# Patient Record
Sex: Male | Born: 2000 | Race: White | Hispanic: No | Marital: Single | Smoking: Never smoker
Health system: Southern US, Community
[De-identification: ages and names within clinical notes are randomized; demographics above are authoritative.]

## PROBLEM LIST (undated history)

## (undated) DIAGNOSIS — T7840XA Allergy, unspecified, initial encounter: Secondary | ICD-10-CM

## (undated) DIAGNOSIS — S49021A Salter-Harris Type II physeal fracture of upper end of humerus, right arm, initial encounter for closed fracture: Secondary | ICD-10-CM

## (undated) DIAGNOSIS — Z8489 Family history of other specified conditions: Secondary | ICD-10-CM

---
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
## 2021-07-04 ENCOUNTER — Other Ambulatory Visit: Payer: Self-pay | Admitting: Nurse Practitioner

## 2021-07-04 ENCOUNTER — Ambulatory Visit
Admission: RE | Admit: 2021-07-04 | Discharge: 2021-07-04 | Disposition: A | Discharge status: Home / Self Care | Payer: No Typology Code available for payment source | Source: Ambulatory Visit | Attending: Nurse Practitioner | Admitting: Nurse Practitioner

## 2021-07-04 DIAGNOSIS — Z021 Encounter for pre-employment examination: Secondary | ICD-10-CM

## 2023-07-16 IMAGING — CR DG CHEST 1V
1 series · 1 of 1 positions shown · non-contrast
Comparison: None.

CLINICAL DATA: 20-year-old male with a history of pre-employment
chest x-ray

EXAM:
CHEST  1 VIEW

[w chest pa]
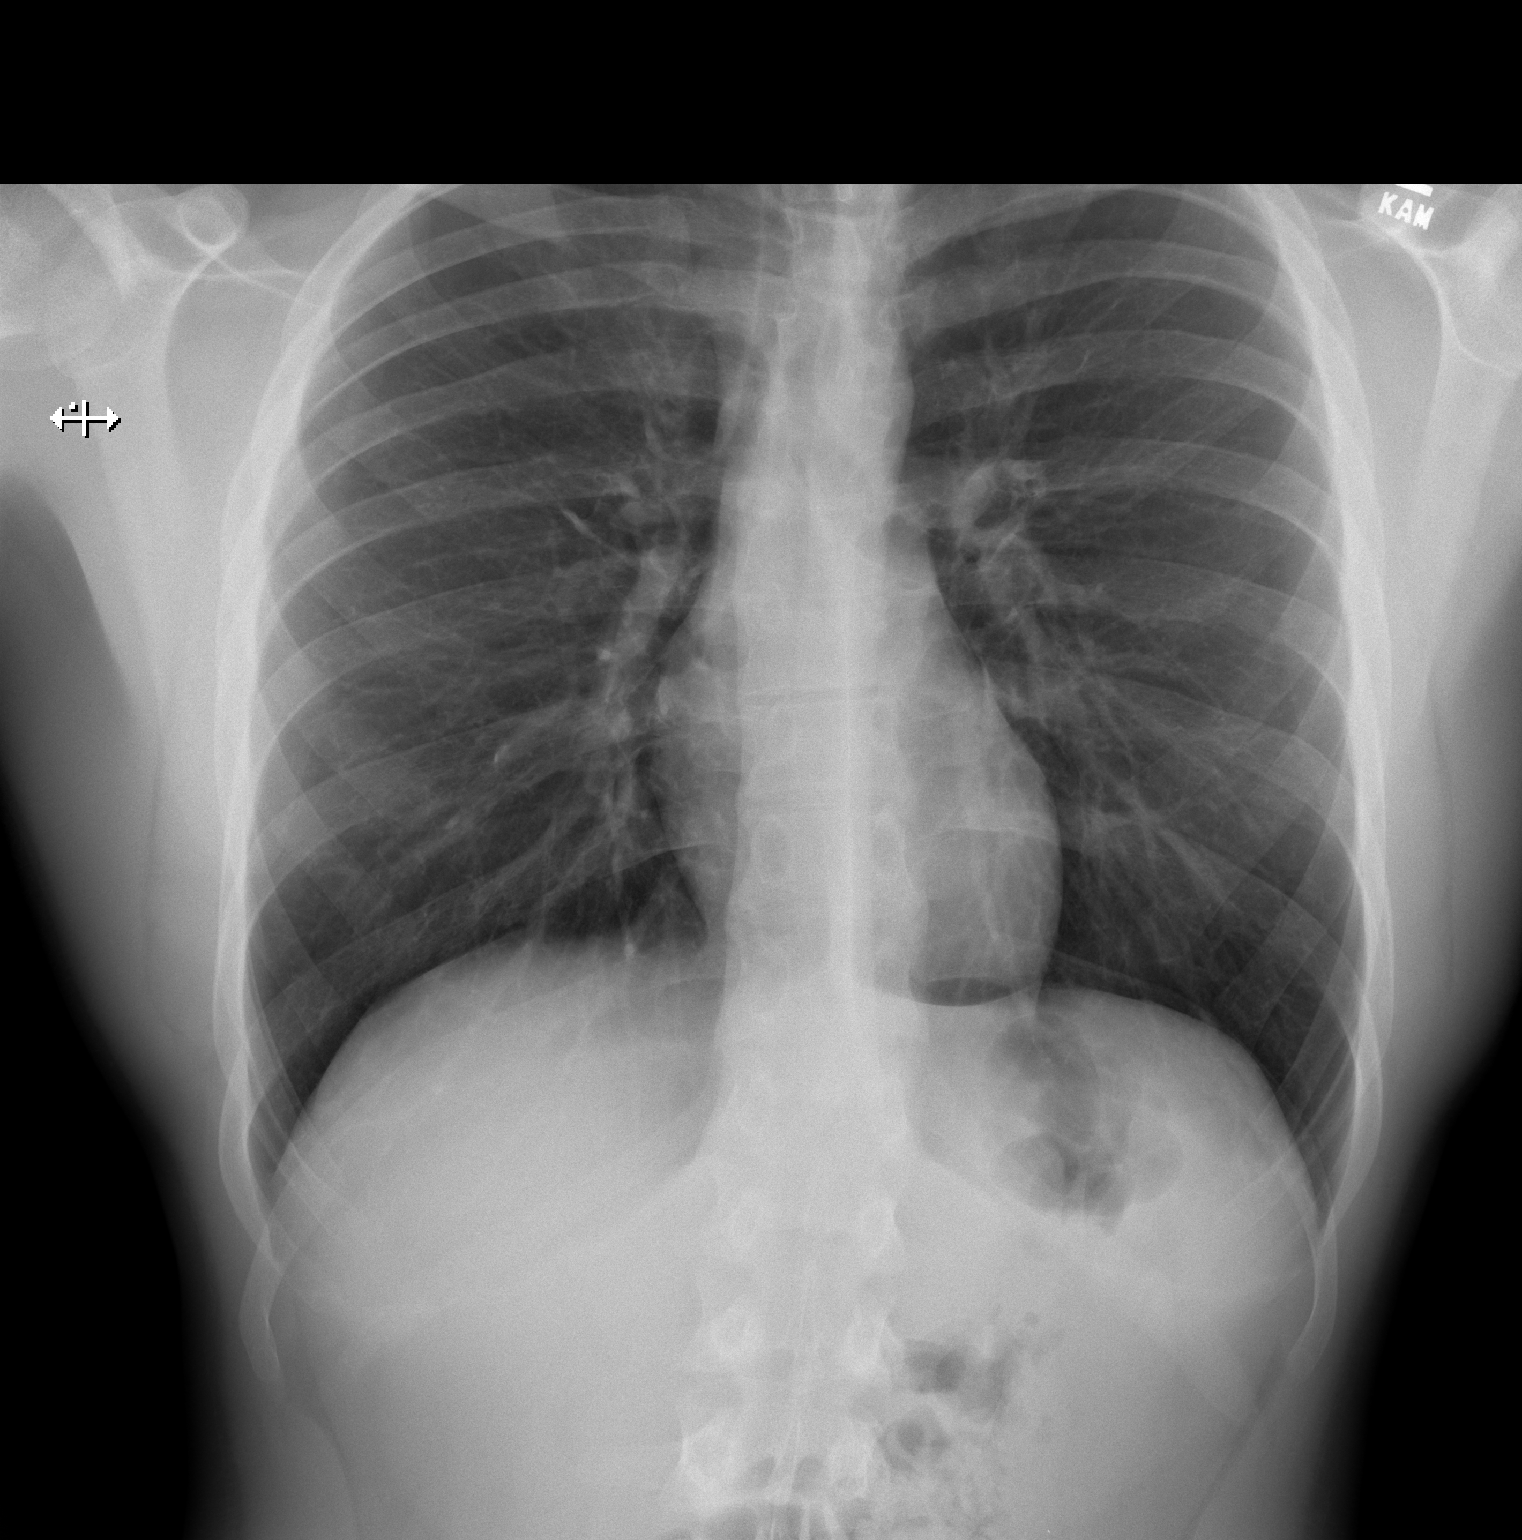

[1 of 1 positions shown; findings below may reference images not displayed]

FINDINGS: Cardiomediastinal silhouette within normal limits in size and
contour. No evidence of central vascular congestion. No interlobular
septal thickening. No pneumothorax or pleural effusion. No confluent
airspace disease.

No acute displaced fracture

Mild scoliotic curvature of the thoracic spine
IMPRESSION: No active disease.
